# Patient Record
Sex: Female | Born: 1955 | Race: White | Hispanic: No | Marital: Married | Smoking: Never smoker
Health system: Southern US, Community
[De-identification: ages and names within clinical notes are randomized; demographics above are authoritative.]

## PROBLEM LIST (undated history)

## (undated) DIAGNOSIS — K589 Irritable bowel syndrome without diarrhea: Secondary | ICD-10-CM

## (undated) DIAGNOSIS — T7840XA Allergy, unspecified, initial encounter: Secondary | ICD-10-CM

## (undated) DIAGNOSIS — M8580 Other specified disorders of bone density and structure, unspecified site: Secondary | ICD-10-CM

## (undated) DIAGNOSIS — K115 Sialolithiasis: Secondary | ICD-10-CM

## (undated) HISTORY — PX: OOPHORECTOMY: SHX86

## (undated) HISTORY — DX: Other specified disorders of bone density and structure, unspecified site: M85.80

## (undated) HISTORY — PX: APPENDECTOMY: SHX54

## (undated) HISTORY — DX: Irritable bowel syndrome, unspecified: K58.9

## (undated) HISTORY — DX: Allergy, unspecified, initial encounter: T78.40XA

## (undated) HISTORY — DX: Sialolithiasis: K11.5

## (undated) HISTORY — PX: LAPAROSCOPY ABDOMEN DIAGNOSTIC: PRO50

## (undated) HISTORY — PX: SALIVARY STONE REMOVAL: SHX5213

---

## 1997-06-11 HISTORY — PX: ABDOMINAL HYSTERECTOMY: SHX81

## 2013-04-14 ENCOUNTER — Ambulatory Visit: Payer: Self-pay

## 2013-04-21 ENCOUNTER — Ambulatory Visit: Payer: Self-pay | Admitting: Otolaryngology

## 2013-10-13 ENCOUNTER — Ambulatory Visit: Payer: Self-pay | Admitting: Otolaryngology

## 2013-11-30 ENCOUNTER — Ambulatory Visit: Payer: Self-pay | Admitting: Otolaryngology

## 2013-12-01 LAB — PATHOLOGY REPORT

## 2014-10-02 NOTE — Op Note (Signed)
PATIENT NAME:  BOLDLouie Shaw, Jo Shaw MR#:  [id number] DATE OF BIRTH:  16-Nov-1955  DATE OF PROCEDURE:  11/30/2013  PREOPERATIVE DIAGNOSES:  1. Left submandibular sialoadenitis.  2. Left submandibular sialolithiasis.   POSTOPERATIVE DIAGNOSES:  1. Left submandibular sialoadenitis.  2. Left submandibular sialolithiasis.   OPERATIVE PROCEDURE: Excision left submandibular gland with removal of intraoral duct.   SURGEON: Cammy CopaPaul H. Gurman Ashland, M.D.   ASSISTANT: Dr. Marion DownerScott Bennett.  ANESTHESIA: General.   COMPLICATIONS: None.    TOTAL ESTIMATED BLOOD LOSS: Minimal.   PROCEDURE: The patient was given general anesthesia by oral endotracheal intubation. The patient was supine position with the shoulder roll head, turned to the left side. The neck was marked and an incision line was marked at two fingerbreadths below the margin of the mandible. The incision line was then injected with 4 mL of 1% Xylocaine mixed with epinephrine 1:100,000. The patient was prepped and draped in sterile fashion.   The incision was created following the marked lines, carried through the skin and subcutaneous. Bleeding was controlled with electrocautery. The platysma was elevated with care taken to evaluate the facial nerve to make sure there was no injury to the marginal mandibular branch. Electrodes then placed around the perioral muscle to monitor this continuously. With the platysma layer cut through, the fascial vein was noted. This was crossclamped and tied and elevated, this elevated the marginal mandibular branch of the facial nerve. The inferior portion of the submandibular gland was very evident and it was quite prominent, was freed inferiorly from its fascial attachments, posteriorly it is freed up as well and anteriorly. Once this was freed up there a clamp was placed on the gland and it was delivered out of the wound, you could see now  the more superficial attachments were freed superiorly, with again care taken to make sure  the marginal branch was not compromised. More anteriorly, the mylohyoid muscle was pulled back, with the attachments here. The lingual nerve was located and part of the facial artery went through the gland and needed to be crossclamped and tied here as well. The branch from the lingual nerve to the gland was cut across with the Harmonic. The gland was followed anteriorly with the duct into sublingual gland. There was significant amount of sublingual tissue, much of this was trimmed off and then the gland was cut across with the Harmonic. The wound was extremely dry. There is no significant blood loss at all. A small #7 TLS drain was placed through a separate inferior stab incision. The wound was copiously irrigated. No further bleeding was noted. The platysma was closed with 4-0 Vicryl followed by 4-0 Vicryl for the subcutaneous tissues. The skin edges were then closed with 6-0 nylon in a running locking suture. The wound was then covered with Telfa and Tegaderm dressing.   The patient was then turned back to midline and a mouth gag was used for opening up the teeth to visualize the floor of the mouth. The left tip of Wharton's duct was reddened sticking out a little bit. A couple of milliliters of 1% Xylocaine with epinephrine 1:200,000 were used for infiltration of the floor of the mouth. An incision was created around the duct through the mucosa and the mucosa was tracked back and the duct was removed from this area. There is some attachments of minor salivary glands to the duct. These were freed up. The duct was somewhat thickened and followed all the way back to its base removing some of  the sublingual gland attachments to it as well. This left a smooth floor of the mouth. The lingual nerve was intact underneath the fascia of the tongue musculature, and there is no significant bleeding here. 5-0 chromic were then used to close the mucosa loosely at the left anterior floor of the mouth.   The patient  tolerated the procedure well. She was awakened and taken to the recovery room in satisfactory condition. There were no operative complications.   ____________________________ Cammy Copa, MD phj:sg D: 11/30/2013 09:25:06 ET T: 11/30/2013 09:42:00 ET JOB#: [id number]  cc: Cammy Copa, MD, <Dictator> Cammy Copa MD ELECTRONICALLY SIGNED 12/15/2013 8:41

## 2015-07-01 LAB — HM COLONOSCOPY

## 2015-09-03 IMAGING — CT CT NECK WITHOUT AND WITH CONTRAST
4 of 6 series · 15 of 33 positions shown, 17 images · IV contrast (isovue)
Comparison: None.

CLINICAL DATA: Left neck swelling of 1 year duration. Skin markers
in place on the left. Left-sided pain.

EXAM:
CT NECK WITH AND WITHOUT CONTRAST
TECHNIQUE: Multidetector CT imaging of the neck was performed without and with
intravenous contrast.
CONTRAST:  75 cc Isovue 370

[Series 5: axial · axial · 0.43mm/px · z∈[-205,-101]mm · 3 of 106 slices shown]
[im 27/106  bone]
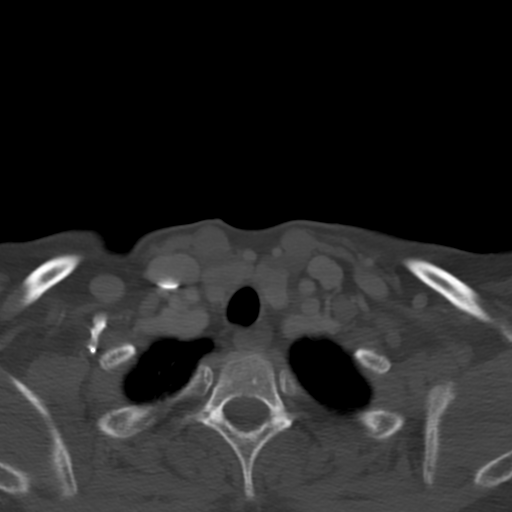
[im 53/106  bone]
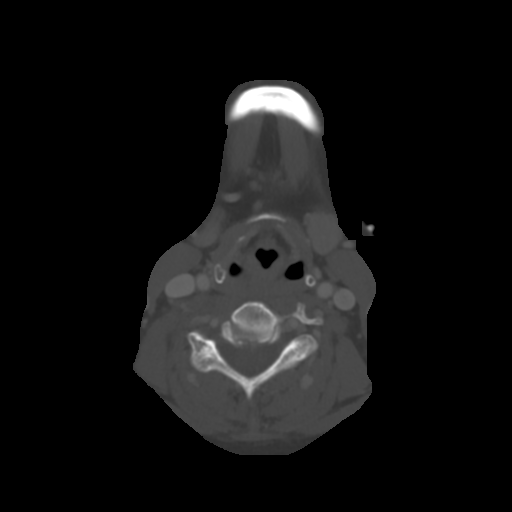
[im 79/106  bone]
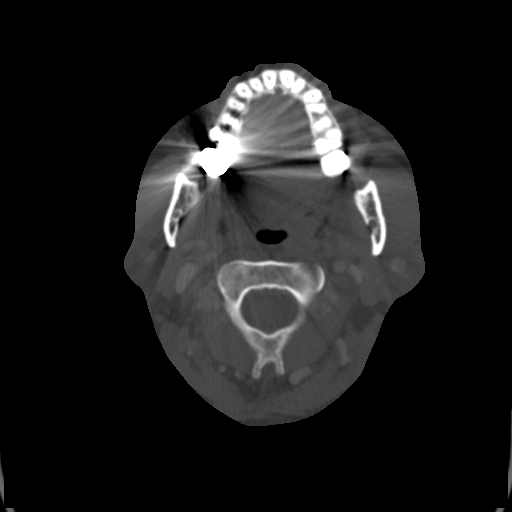

[Series 6: sag neck · sagittal · 0.42mm/px · 5 of 101 slices shown, 6 images]
[im 34/101  bone]
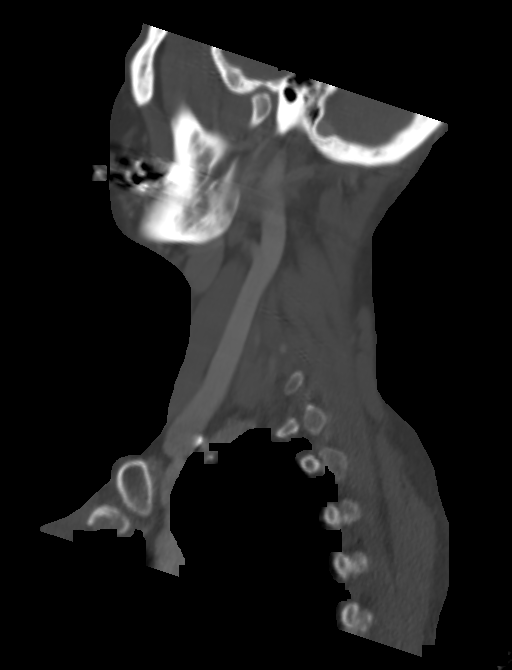
[im 42/101  bone]
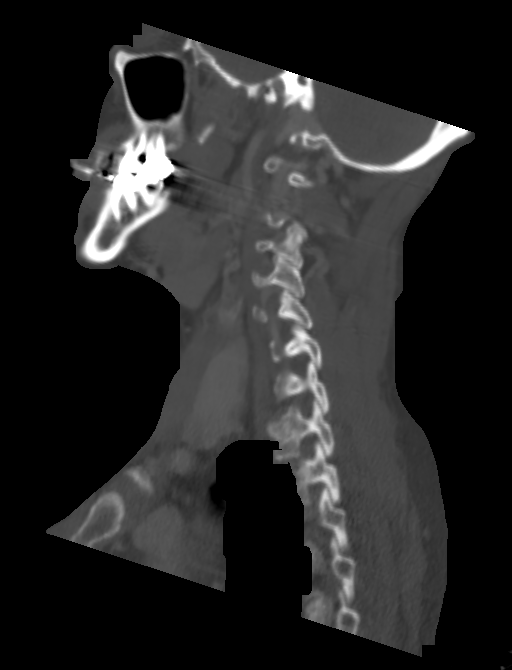
[im 51/101  soft-tissue]
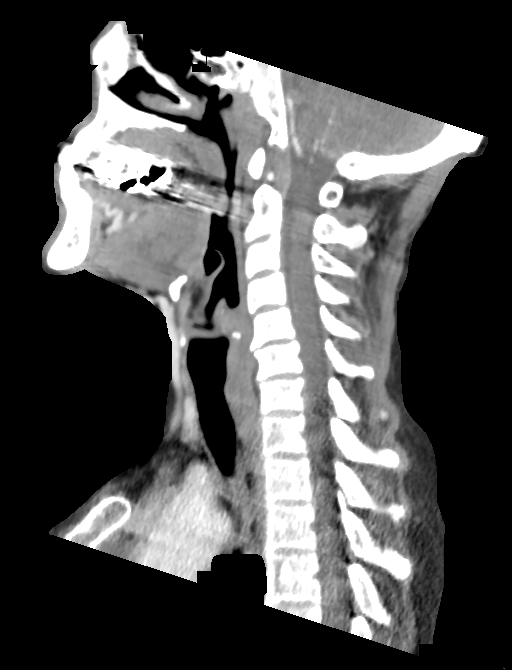
[im 51/101  bone]
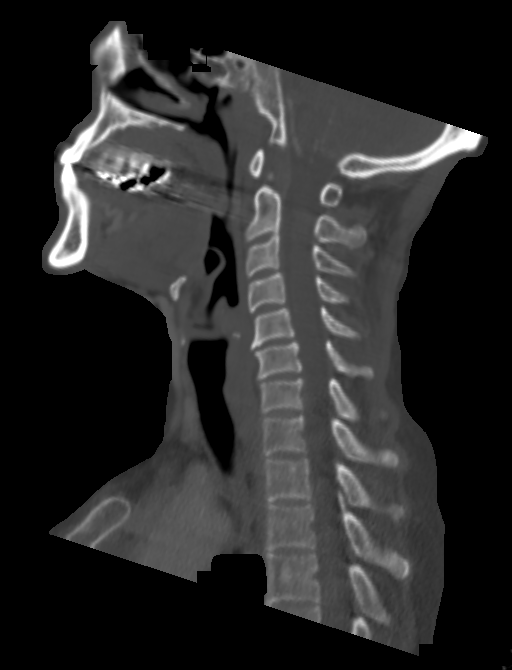
[im 59/101  bone]
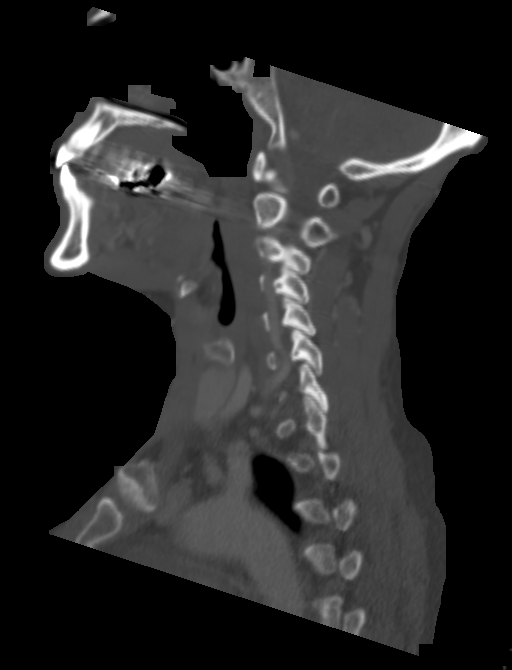
[im 67/101  bone]
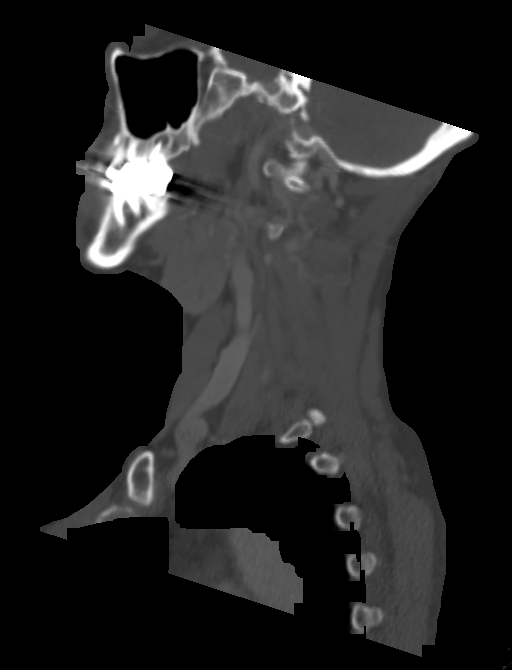

[Series 7: cor neck · coronal · 0.43mm/px · 3 of 106 slices shown]
[im 33/106  bone]
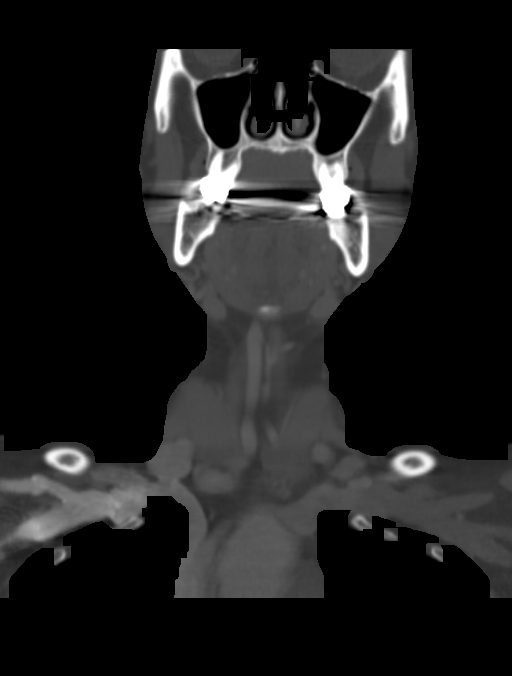
[im 46/106  bone]
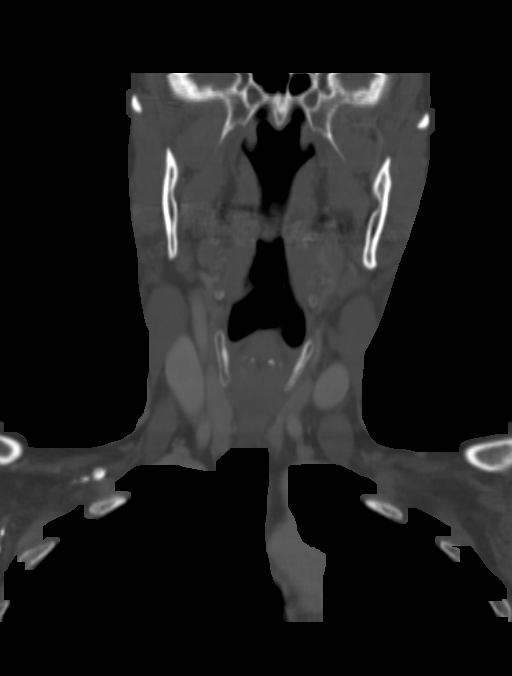
[im 60/106  bone]
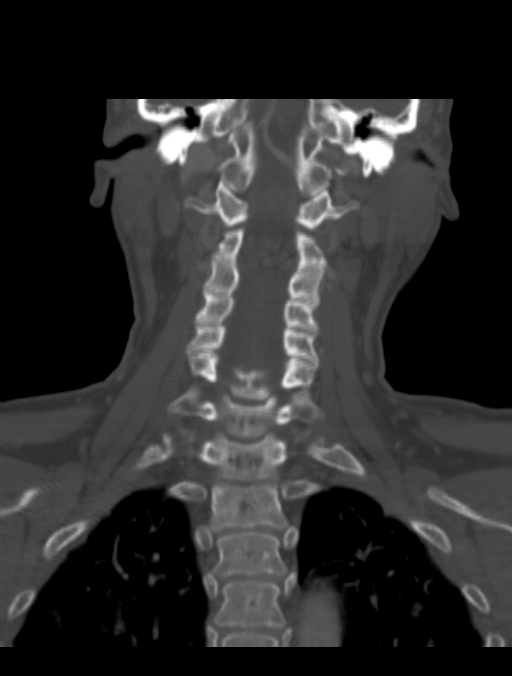

[Series 8: ax oropharynx · axial · 0.43mm/px · z∈[-268,-116]mm · 4 of 136 slices shown, 5 images]
[im 28/136  soft-tissue]
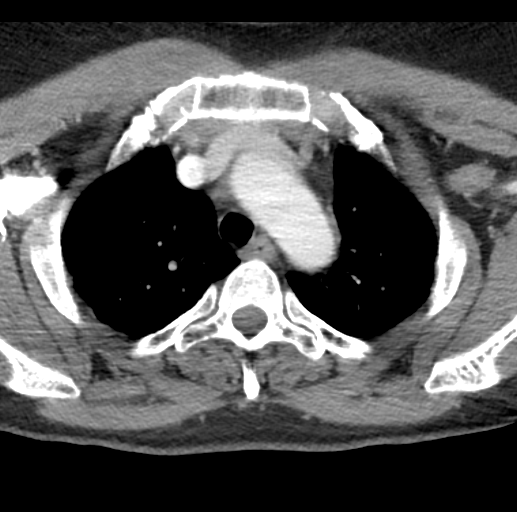
[im 28/136  bone]
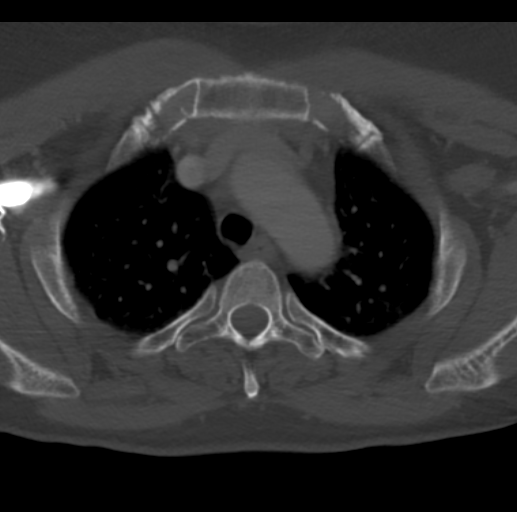
[im 55/136  bone]
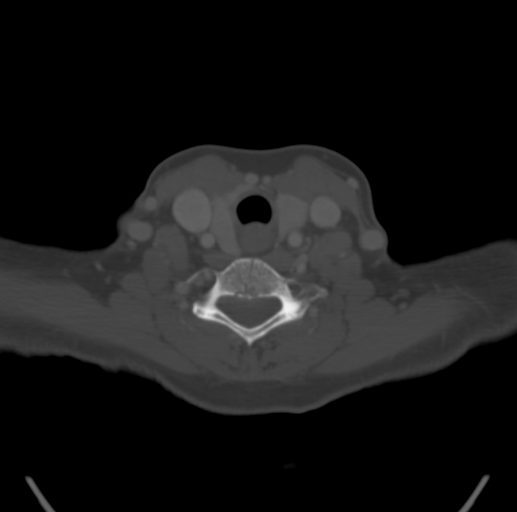
[im 82/136  bone]
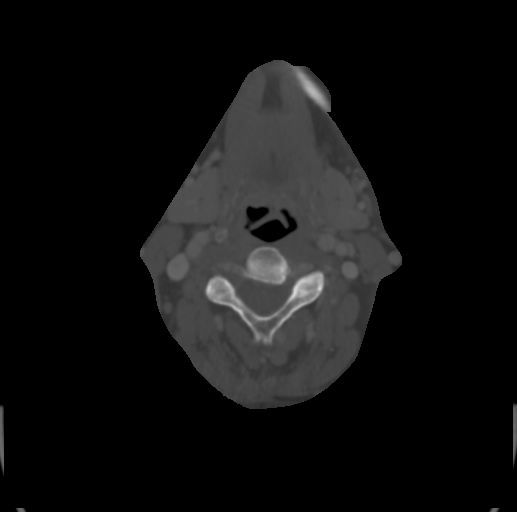
[im 109/136  bone]
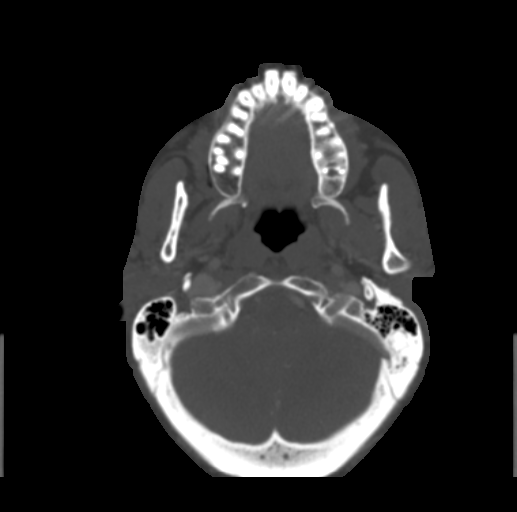

[15 of 33 positions shown; findings below may reference images not displayed]

FINDINGS: Limited visualization of the intracranial contents does not show any
abnormality. Visualized sinuses are clear.

Both parotid glands are normal. No evidence of mass, inflammation,
ductal dilatation or stone. Some streak artifact from the teeth does
extend through the region of the parotid glands.

Both submandibular glands appear symmetric and normal. No swelling
or apparent active inflammation patient does have a 4 mm stone in
the distal submandibular duct on the left.

Thyroid gland is normal. No enlarged lymph nodes or other soft
tissue masses on either side of the neck. Arterial and venous
structures appear normal. There is ordinary mild cervical
spondylosis.
IMPRESSION: 4 mm stone in the distal submandibular duct on the left. No other
salivary stone. No sign of inflammation or ductal obstruction by
imaging however.

## 2017-09-16 ENCOUNTER — Ambulatory Visit (INDEPENDENT_AMBULATORY_CARE_PROVIDER_SITE_OTHER): Payer: BC Managed Care – PPO | Admitting: Family Medicine

## 2017-09-16 ENCOUNTER — Ambulatory Visit
Admission: RE | Admit: 2017-09-16 | Discharge: 2017-09-16 | Disposition: A | Discharge status: Home / Self Care | Payer: BC Managed Care – PPO | Source: Ambulatory Visit | Attending: Family Medicine | Admitting: Family Medicine

## 2017-09-16 ENCOUNTER — Encounter: Payer: Self-pay | Admitting: Family Medicine

## 2017-09-16 VITALS — BP 116/70 | HR 76 | Temp 98.2°F | Resp 16 | Ht 65.0 in | Wt 188.0 lb

## 2017-09-16 DIAGNOSIS — R0989 Other specified symptoms and signs involving the circulatory and respiratory systems: Secondary | ICD-10-CM | POA: Insufficient documentation

## 2017-09-16 DIAGNOSIS — R05 Cough: Secondary | ICD-10-CM | POA: Insufficient documentation

## 2017-09-16 DIAGNOSIS — L918 Other hypertrophic disorders of the skin: Secondary | ICD-10-CM | POA: Diagnosis not present

## 2017-09-16 DIAGNOSIS — Z1231 Encounter for screening mammogram for malignant neoplasm of breast: Secondary | ICD-10-CM

## 2017-09-16 DIAGNOSIS — Z1159 Encounter for screening for other viral diseases: Secondary | ICD-10-CM

## 2017-09-16 DIAGNOSIS — Z1239 Encounter for other screening for malignant neoplasm of breast: Secondary | ICD-10-CM

## 2017-09-16 DIAGNOSIS — Z0000 Encounter for general adult medical examination without abnormal findings: Secondary | ICD-10-CM | POA: Diagnosis not present

## 2017-09-16 DIAGNOSIS — Z780 Asymptomatic menopausal state: Secondary | ICD-10-CM

## 2017-09-16 DIAGNOSIS — K589 Irritable bowel syndrome without diarrhea: Secondary | ICD-10-CM | POA: Insufficient documentation

## 2017-09-16 DIAGNOSIS — M8580 Other specified disorders of bone density and structure, unspecified site: Secondary | ICD-10-CM

## 2017-09-16 DIAGNOSIS — K581 Irritable bowel syndrome with constipation: Secondary | ICD-10-CM

## 2017-09-16 MED ORDER — BENZONATATE 200 MG PO CAPS: 200.0000 mg | ORAL_CAPSULE | Freq: Two times a day (BID) | ORAL | 0 refills | Status: DC | PRN

## 2017-09-16 NOTE — Assessment & Plan Note (Signed)
Referral to dermatology per patient preference

## 2017-09-16 NOTE — Assessment & Plan Note (Signed)
Followed by gastroenterology Continue current medications We will request records from GI

## 2017-09-16 NOTE — Patient Instructions (Signed)
Preventive Care 40-64 Years, Female Preventive care refers to lifestyle choices and visits with your health care provider that can promote health and wellness. What does preventive care include?  A yearly physical exam. This is also called an annual well check.  Dental exams once or twice a year.  Routine eye exams. Ask your health care provider how often you should have your eyes checked.  Personal lifestyle choices, including: ? Daily care of your teeth and gums. ? Regular physical activity. ? Eating a healthy diet. ? Avoiding tobacco and drug use. ? Limiting alcohol use. ? Practicing safe sex. ? Taking low-dose aspirin daily starting at age 58. ? Taking vitamin and mineral supplements as recommended by your health care provider. What happens during an annual well check? The services and screenings done by your health care provider during your annual well check will depend on your age, overall health, lifestyle risk factors, and family history of disease. Counseling Your health care provider may ask you questions about your:  Alcohol use.  Tobacco use.  Drug use.  Emotional well-being.  Home and relationship well-being.  Sexual activity.  Eating habits.  Work and work Statistician.  Method of birth control.  Menstrual cycle.  Pregnancy history.  Screening You may have the following tests or measurements:  Height, weight, and BMI.  Blood pressure.  Lipid and cholesterol levels. These may be checked every 5 years, or more frequently if you are over 81 years old.  Skin check.  Lung cancer screening. You may have this screening every year starting at age 78 if you have a 30-pack-year history of smoking and currently smoke or have quit within the past 15 years.  Fecal occult blood test (FOBT) of the stool. You may have this test every year starting at age 65.  Flexible sigmoidoscopy or colonoscopy. You may have a sigmoidoscopy every 5 years or a colonoscopy  every 10 years starting at age 30.  Hepatitis C blood test.  Hepatitis B blood test.  Sexually transmitted disease (STD) testing.  Diabetes screening. This is done by checking your blood sugar (glucose) after you have not eaten for a while (fasting). You may have this done every 1-3 years.  Mammogram. This may be done every 1-2 years. Talk to your health care provider about when you should start having regular mammograms. This may depend on whether you have a family history of breast cancer.  BRCA-related cancer screening. This may be done if you have a family history of breast, ovarian, tubal, or peritoneal cancers.  Pelvic exam and Pap test. This may be done every 3 years starting at age 80. Starting at age 36, this may be done every 5 years if you have a Pap test in combination with an HPV test.  Bone density scan. This is done to screen for osteoporosis. You may have this scan if you are at high risk for osteoporosis.  Discuss your test results, treatment options, and if necessary, the need for more tests with your health care provider. Vaccines Your health care provider may recommend certain vaccines, such as:  Influenza vaccine. This is recommended every year.  Tetanus, diphtheria, and acellular pertussis (Tdap, Td) vaccine. You may need a Td booster every 10 years.  Varicella vaccine. You may need this if you have not been vaccinated.  Zoster vaccine. You may need this after age 5.  Measles, mumps, and rubella (MMR) vaccine. You may need at least one dose of MMR if you were born in  1957 or later. You may also need a second dose.  Pneumococcal 13-valent conjugate (PCV13) vaccine. You may need this if you have certain conditions and were not previously vaccinated.  Pneumococcal polysaccharide (PPSV23) vaccine. You may need one or two doses if you smoke cigarettes or if you have certain conditions.  Meningococcal vaccine. You may need this if you have certain  conditions.  Hepatitis A vaccine. You may need this if you have certain conditions or if you travel or work in places where you may be exposed to hepatitis A.  Hepatitis B vaccine. You may need this if you have certain conditions or if you travel or work in places where you may be exposed to hepatitis B.  Haemophilus influenzae type b (Hib) vaccine. You may need this if you have certain conditions.  Talk to your health care provider about which screenings and vaccines you need and how often you need them. This information is not intended to replace advice given to you by your health care provider. Make sure you discuss any questions you have with your health care provider. Document Released: 06/24/2015 Document Revised: 02/15/2016 Document Reviewed: 03/29/2015 Elsevier Interactive Patient Education  2018 Elsevier Inc.  

## 2017-09-16 NOTE — Assessment & Plan Note (Signed)
Patient with URI symptoms and crackles in right lung base Get chest x-ray to ensure no pneumonia Could also represent atelectasis We will treat as indicated by chest x-ray

## 2017-09-16 NOTE — Assessment & Plan Note (Signed)
Repeat DEXA scan Continue calcium and vitamin D supplement Request records from previous PCP

## 2017-09-16 NOTE — Progress Notes (Signed)
Patient: Jo Shaw, Female    DOB: October 04, 1955, 62 y.o.   MRN: [id number] Visit Date: 09/16/2017  Today's Provider: Shirlee Latch, MD   I, Jo Shaw, CMA, am acting as scribe for Shirlee Latch, MD.  Chief Complaint  Patient presents with  . Establish Care   Subjective:    Establish Care Jo Shaw Jo Shaw is a 62 y.o. female who presents today to establish care and for a physical. She feels fairly well. She is c/o URI sx. She reports exercising none. She reports she is sleeping fairly well.  She reports cough, nasal congestion, rhinorrhea, shortness of breath for the last 3-5 days. She has not taken any medication for this.  She denies any chest pain.  She does not think she has had any fevers.  She denies abdominal pain, dysuria, or other symptoms.  Patient was previously followed by dermatology for multiple skin issues.  She is concerned about multiple skin tags around her necklace area and axilla.  She reports these become irritated when they catch on her clothing and they also itch sometimes.  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  She believes that she had another colonoscopy around 2017 with Dr. Pennie Banter in [facility].  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  She is taking Amitiza and Colace which seem to help with her constipation Last mammogram- pt is due. States her last mammogram was checked by [facility] many years ago. Pt is s/p total hysterectomy (1999) due to endometriosis. Last BMD- 5 years ago. Osteopenia.  Has been taking Ca and Vit D.  Tries to stay active with yard work. Pt believes her last tetanus vaccine was in 2010. She had a shingles vaccine a few years ago at [facility] in [facility]. -----------------------------------------------------------------   Review of Systems  Constitutional: Negative.   HENT: Positive for dental problem.  Negative for congestion, drooling, ear discharge, ear pain, facial swelling, hearing loss, mouth sores, nosebleeds, postnasal drip, rhinorrhea, sinus pressure, sinus pain, sneezing, sore throat, tinnitus, trouble swallowing and voice change.   Eyes: Positive for photophobia. Negative for pain, discharge, redness, itching and visual disturbance.  Respiratory: Negative.   Cardiovascular: Negative.   Gastrointestinal: Positive for abdominal distention, abdominal pain, anal bleeding, constipation and diarrhea. Negative for blood in stool, nausea, rectal pain and vomiting.  Endocrine: Negative.   Genitourinary: Positive for decreased urine volume and frequency. Negative for difficulty urinating, dyspareunia, dysuria, enuresis, flank pain, genital sores, hematuria, menstrual problem, pelvic pain, urgency, vaginal bleeding, vaginal discharge and vaginal pain.  Musculoskeletal: Negative.   Skin: Negative.   Allergic/Immunologic: Positive for environmental allergies. Negative for food allergies and immunocompromised state.  Neurological: Positive for headaches. Negative for dizziness, tremors, seizures, syncope, facial asymmetry, speech difficulty, weakness, light-headedness and numbness.  Hematological: Negative.   Psychiatric/Behavioral: Negative.     Social History      She  reports that she has never smoked. She has never used smokeless tobacco. She reports that she does not drink alcohol or use drugs.       Social History   Socioeconomic History  . Marital status: Married    Spouse name: Lillia Abed  . Number of children: 1  . Years of education: 23  . Highest education level: Some college, no degree  Occupational History  . Occupation: Artist: [employer]  Social Needs  .  Financial resource strain: Not hard at all  . Food insecurity:    Worry: Never true    Inability: Never true  . Transportation needs:    Medical: No    Non-medical: No  Tobacco Use  .  Smoking status: Never Smoker  . Smokeless tobacco: Never Used  Substance and Sexual Activity  . Alcohol use: Never    Frequency: Never  . Drug use: Never  . Sexual activity: Not Currently    Partners: Male    Birth control/protection: Surgical  Lifestyle  . Physical activity:    Days per week: 0 days    Minutes per session: 0 min  . Stress: Not on file  Relationships  . Social connections:    Talks on phone: Not on file    Gets together: Not on file    Attends religious service: Not on file    Active member of club or organization: Not on file    Attends meetings of clubs or organizations: Not on file    Relationship status: Not on file  Other Topics Concern  . Not on file  Social History Narrative  . Not on file    Past Medical History:  Diagnosis Date  . Allergy   . Osteopenia   . Salivary stones   . Spastic colon     Patient Active Problem List   Diagnosis Date Noted  . Osteopenia 09/16/2017  . Spastic colon 09/16/2017    Past Surgical History:  Procedure Laterality Date  . ABDOMINAL HYSTERECTOMY  1999   total due to endometriosis  . APPENDECTOMY    . LAPAROSCOPY ABDOMEN DIAGNOSTIC    . SALIVARY STONE REMOVAL      Family History        Family Status  Relation Name Status  . Mother  Alive  . Father  Alive  . Sister  Alive  . Brother  Alive  . Emelda Brothers  (Not Specified)  . Oneal Grout  (Not Specified)  . MGM  (Not Specified)  . PGF  (Not Specified)  . Sister  Alive  . Sister  Alive  . Cousin  (Not Specified)        Her family history includes Breast cancer in her cousin; Cancer in her mother; Cervical cancer in her mother; Healthy in her brother and sister; Heart disease in her maternal grandmother, mother, paternal aunt, and paternal uncle; Hypertension in her sister and sister; Prostate cancer in her father; Sleep apnea in her sister; Throat cancer in her paternal grandfather.      Allergies  Allergen Reactions  . Codeine Nausea And Vomiting     headache     Current Outpatient Medications:  .  AMITIZA 8 MCG capsule, Take 8 mcg by mouth 2 (two) times daily., Disp: , Rfl: 11 .  Calcium-Magnesium-Vitamin D (CALCIUM 1200+D3 PO), Take by mouth., Disp: , Rfl:  .  docusate sodium (COLACE) 250 MG capsule, Take by mouth., Disp: , Rfl:  .  KRILL OIL PO, Take by mouth., Disp: , Rfl:  .  Multiple Vitamin (MULTIVITAMIN) tablet, Take 1 tablet by mouth daily., Disp: , Rfl:  .  Potassium 99 MG TABS, Take by mouth., Disp: , Rfl:  .  vitamin C (ASCORBIC ACID) 500 MG tablet, Take 500 mg by mouth daily., Disp: , Rfl:  .  VITAMIN K PO, Take by mouth., Disp: , Rfl:  .  benzonatate (TESSALON) 200 MG capsule, Take 1 capsule (200 mg total) by mouth 2 (two) times  daily as needed for cough., Disp: 40 capsule, Rfl: 0   Patient Care Team: Erasmo DownerBacigalupo, Gerline Ratto M, MD as PCP - General (Family Medicine)      Objective:   Vitals: BP 116/70 (BP Location: Left Arm, Patient Position: Sitting, Cuff Size: Large)   Pulse 76   Temp 98.2 F (36.8 C) (Oral)   Resp 16   Ht 5\' 5"  (1.651 m)   Wt 188 lb (85.3 kg)   SpO2 96%   BMI 31.28 kg/m    Vitals:   09/16/17 1409  BP: 116/70  Pulse: 76  Resp: 16  Temp: 98.2 F (36.8 C)  TempSrc: Oral  SpO2: 96%  Weight: 188 lb (85.3 kg)  Height: 5\' 5"  (1.651 m)     Physical Exam  Constitutional: She is oriented to person, place, and time. She appears well-developed and well-nourished. No distress.  HENT:  Head: Normocephalic and atraumatic.  Right Ear: External ear normal.  Left Ear: External ear normal.  Nose: Nose normal.  Mouth/Throat: Oropharynx is clear and moist. No oropharyngeal exudate.  Eyes: Pupils are equal, round, and reactive to light. Conjunctivae and EOM are normal. No scleral icterus.  Neck: Neck supple. No thyromegaly present.  Cardiovascular: Normal rate, regular rhythm, normal heart sounds and intact distal pulses.  No murmur heard. Pulmonary/Chest: Effort normal. No respiratory distress. She  has no wheezes. She has rales in the right lower field.  Abdominal: Soft. Bowel sounds are normal. She exhibits no distension. There is no tenderness. There is no rebound and no guarding.  Genitourinary:  Genitourinary Comments: Breasts: breasts appear normal, no suspicious masses, no skin or nipple changes or axillary nodes.   Musculoskeletal: She exhibits no edema or deformity.  Lymphadenopathy:    She has no cervical adenopathy.  Neurological: She is alert and oriented to person, place, and time.  Skin: Skin is warm and dry. No rash noted.  Multiple skin tags around neckline  Psychiatric: She has a normal mood and affect. Her behavior is normal.  Vitals reviewed.    Depression Screen PHQ 2/9 Scores 09/16/2017  PHQ - 2 Score 0    Assessment & Plan:     Routine Health Maintenance and Physical Exam  Exercise Activities and Dietary recommendations Goals    None       There is no immunization history on file for this patient.  Health Maintenance  Topic Date Due  . Hepatitis C Screening  August 24, 1955  . HIV Screening  01/14/1971  . TETANUS/TDAP  01/14/1975  . MAMMOGRAM  01/13/2006  . COLONOSCOPY  01/13/2006  . INFLUENZA VACCINE  01/09/2018     Discussed health benefits of physical activity, and encouraged her to engage in regular exercise appropriate for her age and condition.    -------------------------------------------------------------------- Problem List Items Addressed This Visit      Digestive   Spastic colon    Followed by gastroenterology Continue current medications We will request records from GI      Relevant Medications   AMITIZA 8 MCG capsule   docusate sodium (COLACE) 250 MG capsule     Musculoskeletal and Integument   Osteopenia    Repeat DEXA scan Continue calcium and vitamin D supplement Request records from previous PCP      Relevant Orders   DG Bone Density   Skin tags, multiple acquired    Referral to dermatology per patient  preference      Relevant Orders   Ambulatory referral to Dermatology     Other  Respiratory crackles at right lung base    Patient with URI symptoms and crackles in right lung base Get chest x-ray to ensure no pneumonia Could also represent atelectasis We will treat as indicated by chest x-ray      Relevant Orders   DG Chest 2 View    Other Visit Diagnoses    Encounter for annual physical exam    -  Primary   Relevant Orders   Comprehensive metabolic panel   CBC w/Diff/Platelet   Lipid panel   Postmenopausal       Relevant Orders   DG Bone Density   Need for hepatitis C screening test       Relevant Orders   Hepatitis C Antibody   Screening for breast cancer       Relevant Orders   MS DIGITAL SCREENING TOMO BILATERAL       Return in about 1 year (around 09/17/2018) for physical.   The entirety of the information documented in the History of Present Illness, Review of Systems and Physical Exam were personally obtained by me. Portions of this information were initially documented by Irving Burton Ratchford, CMA and reviewed by me for thoroughness and accuracy.    Erasmo Downer, MD, MPH [facility] 09/16/2017 4:18 PM

## 2017-09-17 ENCOUNTER — Telehealth: Payer: Self-pay

## 2017-09-17 NOTE — Telephone Encounter (Signed)
Pt advised.

## 2017-09-17 NOTE — Telephone Encounter (Signed)
-----   Message from Erasmo DownerAngela M Bacigalupo, MD sent at 09/17/2017  9:46 AM EDT ----- No pneumonia.  Mild bronchitis.  Continue symptomatic treatment. This is viral.  Beryle FlockBacigalupo, Marzella SchleinAngela M, MD, MPH [facility] 09/17/2017 9:46 AM

## 2017-09-18 ENCOUNTER — Telehealth: Payer: Self-pay

## 2017-09-18 LAB — CBC WITH DIFFERENTIAL/PLATELET
BASOS: 0 %
Basophils Absolute: 0 10*3/uL (ref 0.0–0.2)
EOS (ABSOLUTE): 0.1 10*3/uL (ref 0.0–0.4)
Eos: 2 %
HEMATOCRIT: 41.1 % (ref 34.0–46.6)
HEMOGLOBIN: 14.1 g/dL (ref 11.1–15.9)
Immature Grans (Abs): 0 10*3/uL (ref 0.0–0.1)
Immature Granulocytes: 0 %
LYMPHS ABS: 2.2 10*3/uL (ref 0.7–3.1)
Lymphs: 48 %
MCH: 33.4 pg — ABNORMAL HIGH (ref 26.6–33.0)
MCHC: 34.3 g/dL (ref 31.5–35.7)
MCV: 97 fL (ref 79–97)
MONOCYTES: 6 %
MONOS ABS: 0.3 10*3/uL (ref 0.1–0.9)
NEUTROS ABS: 2 10*3/uL (ref 1.4–7.0)
Neutrophils: 44 %
Platelets: 234 10*3/uL (ref 150–379)
RBC: 4.22 x10E6/uL (ref 3.77–5.28)
RDW: 13.5 % (ref 12.3–15.4)
WBC: 4.6 10*3/uL (ref 3.4–10.8)

## 2017-09-18 LAB — COMPREHENSIVE METABOLIC PANEL
A/G RATIO: 2.3 — AB (ref 1.2–2.2)
ALBUMIN: 4.3 g/dL (ref 3.6–4.8)
ALT: 20 IU/L (ref 0–32)
AST: 22 IU/L (ref 0–40)
Alkaline Phosphatase: 84 IU/L (ref 39–117)
BILIRUBIN TOTAL: 0.4 mg/dL (ref 0.0–1.2)
BUN / CREAT RATIO: 17 (ref 12–28)
BUN: 15 mg/dL (ref 8–27)
CHLORIDE: 102 mmol/L (ref 96–106)
CO2: 23 mmol/L (ref 20–29)
Calcium: 8.9 mg/dL (ref 8.7–10.3)
Creatinine, Ser: 0.88 mg/dL (ref 0.57–1.00)
GFR calc Af Amer: 82 mL/min/{1.73_m2} (ref >59)
GFR calc non Af Amer: 71 mL/min/{1.73_m2} (ref >59)
GLOBULIN, TOTAL: 1.9 g/dL (ref 1.5–4.5)
GLUCOSE: 82 mg/dL (ref 65–99)
Potassium: 4.1 mmol/L (ref 3.5–5.2)
SODIUM: 143 mmol/L (ref 134–144)
TOTAL PROTEIN: 6.2 g/dL (ref 6.0–8.5)

## 2017-09-18 LAB — HEPATITIS C ANTIBODY

## 2017-09-18 LAB — LIPID PANEL
CHOL/HDL RATIO: 3.1 ratio (ref 0.0–4.4)
Cholesterol, Total: 162 mg/dL (ref 100–199)
HDL: 53 mg/dL (ref >39)
LDL CALC: 88 mg/dL (ref 0–99)
Triglycerides: 104 mg/dL (ref 0–149)
VLDL Cholesterol Cal: 21 mg/dL (ref 5–40)

## 2017-09-18 NOTE — Telephone Encounter (Signed)
-----   Message from Erasmo DownerAngela M Bacigalupo, MD sent at 09/18/2017  8:33 AM EDT ----- Normal kidney function, liver function, electrolytes, Blood counts, cholesterol.  Negative Hep C screening  Bacigalupo, Marzella SchleinAngela M, MD, MPH [facility] 09/18/2017 8:32 AM

## 2017-09-18 NOTE — Telephone Encounter (Signed)
LMTCB  Thanks,  -Verne Lanuza 

## 2017-09-19 NOTE — Telephone Encounter (Signed)
Pt advised.

## 2017-09-30 ENCOUNTER — Encounter: Payer: Self-pay | Admitting: Family Medicine

## 2017-09-30 ENCOUNTER — Ambulatory Visit (INDEPENDENT_AMBULATORY_CARE_PROVIDER_SITE_OTHER): Payer: BC Managed Care – PPO | Admitting: Family Medicine

## 2017-09-30 ENCOUNTER — Telehealth: Payer: Self-pay | Admitting: Family Medicine

## 2017-09-30 VITALS — BP 112/82 | HR 80 | Temp 98.2°F | Resp 16 | Wt 184.0 lb

## 2017-09-30 DIAGNOSIS — H66001 Acute suppurative otitis media without spontaneous rupture of ear drum, right ear: Secondary | ICD-10-CM

## 2017-09-30 DIAGNOSIS — J0100 Acute maxillary sinusitis, unspecified: Secondary | ICD-10-CM | POA: Diagnosis not present

## 2017-09-30 DIAGNOSIS — J40 Bronchitis, not specified as acute or chronic: Secondary | ICD-10-CM

## 2017-09-30 MED ORDER — AMOXICILLIN-POT CLAVULANATE 875-125 MG PO TABS: 1.0000 | ORAL_TABLET | Freq: Two times a day (BID) | ORAL | 0 refills | Status: AC

## 2017-09-30 MED ORDER — BENZONATATE 200 MG PO CAPS: 200.0000 mg | ORAL_CAPSULE | Freq: Two times a day (BID) | ORAL | 0 refills | Status: AC | PRN

## 2017-09-30 MED ORDER — ONDANSETRON HCL 4 MG PO TABS: 4.0000 mg | ORAL_TABLET | Freq: Three times a day (TID) | ORAL | 0 refills | Status: AC | PRN

## 2017-09-30 NOTE — Progress Notes (Signed)
Patient: Jo Shaw Female    DOB: 25-Apr-1956   62 y.o.   MRN: [id number] Visit Date: 09/30/2017  Today's Provider: Shirlee Latch, MD   I, [name], CMA, am acting as scribe for Shirlee Latch, MD.  Chief Complaint  Patient presents with  . URI   Subjective:    URI   Episode onset: x 2-3 weeks. Progression since onset: pt though she was improving, but she suddenly worsened over the weekend. There has been no fever. Associated symptoms include abdominal pain, chest pain, congestion, coughing (dry), diarrhea, headaches, nausea, neck pain, a plugged ear sensation, rhinorrhea, sinus pain and wheezing. Pertinent negatives include no dysuria, ear pain, sneezing, sore throat, swollen glands or vomiting. Treatments tried: [employer], Mucinex, Coricidin. The treatment provided mild relief.   Cough is productive of clear sputum.    Allergies  Allergen Reactions  . Codeine Nausea And Vomiting    headache     Current Outpatient Medications:  .  AMITIZA 8 MCG capsule, Take 8 mcg by mouth 2 (two) times daily., Disp: , Rfl: 11 .  benzonatate (TESSALON) 200 MG capsule, Take 1 capsule (200 mg total) by mouth 2 (two) times daily as needed for cough., Disp: 40 capsule, Rfl: 0 .  Calcium-Magnesium-Vitamin D (CALCIUM 1200+D3 PO), Take by mouth., Disp: , Rfl:  .  docusate sodium (COLACE) 250 MG capsule, Take by mouth., Disp: , Rfl:  .  KRILL OIL PO, Take by mouth., Disp: , Rfl:  .  Multiple Vitamin (MULTIVITAMIN) tablet, Take 1 tablet by mouth daily., Disp: , Rfl:  .  Potassium 99 MG TABS, Take by mouth., Disp: , Rfl:  .  vitamin C (ASCORBIC ACID) 500 MG tablet, Take 500 mg by mouth daily., Disp: , Rfl:  .  VITAMIN K PO, Take by mouth., Disp: , Rfl:   Review of Systems  HENT: Positive for congestion, rhinorrhea and sinus pain. Negative for ear pain, sneezing and sore throat.   Respiratory: Positive for cough (dry) and wheezing.   Cardiovascular: Positive for chest  pain.  Gastrointestinal: Positive for abdominal pain, diarrhea and nausea. Negative for vomiting.  Genitourinary: Negative for dysuria.  Musculoskeletal: Positive for neck pain.  Neurological: Positive for headaches.    Social History   Tobacco Use  . Smoking status: Never Smoker  . Smokeless tobacco: Never Used  Substance Use Topics  . Alcohol use: Never    Frequency: Never   Objective:   BP 112/82 (BP Location: Left Arm, Patient Position: Sitting, Cuff Size: Large)   Pulse 80   Temp 98.2 F (36.8 C) (Oral)   Resp 16   Wt 184 lb (83.5 kg)   SpO2 96%   BMI 30.62 kg/m  Vitals:   09/30/17 0938  BP: 112/82  Pulse: 80  Resp: 16  Temp: 98.2 F (36.8 C)  TempSrc: Oral  SpO2: 96%  Weight: 184 lb (83.5 kg)     Physical Exam  Constitutional: She is oriented to person, place, and time. She appears well-developed and well-nourished. No distress.  HENT:  Head: Normocephalic and atraumatic.  Right Ear: External ear and ear canal normal. Tympanic membrane is erythematous and bulging.  Left Ear: Tympanic membrane, external ear and ear canal normal.  Nose: Mucosal edema and rhinorrhea present. Right sinus exhibits maxillary sinus tenderness. Right sinus exhibits no frontal sinus tenderness. Left sinus exhibits maxillary sinus tenderness. Left sinus exhibits no frontal sinus tenderness.  Mouth/Throat: Uvula is midline and mucous  membranes are normal. No uvula swelling. Posterior oropharyngeal erythema present. No oropharyngeal exudate or posterior oropharyngeal edema.  Eyes: Pupils are equal, round, and reactive to light. Conjunctivae are normal. Right eye exhibits no discharge. Left eye exhibits no discharge. No scleral icterus.  Neck: Neck supple. No thyromegaly present.  Cardiovascular: Normal rate, regular rhythm and normal heart sounds.  No murmur heard. Pulmonary/Chest: Effort normal and breath sounds normal. No respiratory distress. She has no wheezes. She has no rales.    Intermittent cough  Musculoskeletal: She exhibits no edema.  Lymphadenopathy:    She has no cervical adenopathy.  Neurological: She is alert and oriented to person, place, and time.  Skin: Skin is warm and dry. Capillary refill takes less than 2 seconds. No rash noted.  Psychiatric: She has a normal mood and affect. Her behavior is normal.  Vitals reviewed.      Assessment & Plan:   1. Acute non-recurrent maxillary sinusitis - time course in addition to initial improvement with acute worsening and sinus tenderness on exam c/w bacterial sinusitis - treat with 10d course of Augmentin - also discussed importance of symptomatic management with flonase, nasal saline, mucinex, etc  2. Non-recurrent acute suppurative otitis media of right ear without spontaneous rupture of tympanic membrane - R TM bulging and erythematous - Augmentin will also treat AOM - return precautions discussed  3. Bronchitis - Lungs clear on exam - recent CXR from earlier this month reviewed and showed mild bronchitic changes which is likely cause of prolonged coughing - discussed with patient that bronchitis is almost always viral in nature - discussed natural course, symptomatic management, and return precautions    Meds ordered this encounter  Medications  . amoxicillin-clavulanate (AUGMENTIN) 875-125 MG tablet    Sig: Take 1 tablet by mouth 2 (two) times daily.    Dispense:  20 tablet    Refill:  0  . benzonatate (TESSALON) 200 MG capsule    Sig: Take 1 capsule (200 mg total) by mouth 2 (two) times daily as needed for cough.    Dispense:  60 capsule    Refill:  0     Return if symptoms worsen or fail to improve.   The entirety of the information documented in the History of Present Illness, Review of Systems and Physical Exam were personally obtained by me. Portions of this information were initially documented by Irving BurtonEmily Ratchford, CMA and reviewed by me for thoroughness and accuracy.     Erasmo DownerBacigalupo, Evren Shankland M, MD, MPH [facility] 09/30/2017 10:07 AM

## 2017-09-30 NOTE — Telephone Encounter (Signed)
Please review

## 2017-09-30 NOTE — Telephone Encounter (Signed)
Can take Zofran prn for N/V.  If continues, can consider different antibiotic.  Rx sent to pharmacy.  Erasmo DownerBacigalupo, Angela M, MD, MPH [facility] 09/30/2017 4:42 PM

## 2017-09-30 NOTE — Telephone Encounter (Signed)
Pt advised.

## 2017-09-30 NOTE — Patient Instructions (Signed)

## 2017-09-30 NOTE — Telephone Encounter (Signed)
Pt stated that at her OV today it was discussed that some antibiotics make her nauseous and she was advised that if amoxicillin-clavulanate (AUGMENTIN) 875-125 MG tablet made her nauseous to call back and Dr. B would send something in to help. [facility] [address] Please advise. Thanks TNP

## 2017-09-30 NOTE — Telephone Encounter (Signed)
Pt called back stated that she vomited and knows that it was the amoxicillin-clavulanate (AUGMENTIN) 875-125 MG tablet that made her sick so she is going to stop taking the medication until she hears back about getting the medication for the nausea. Please advise. Thanks TNP

## 2017-10-09 ENCOUNTER — Telehealth: Payer: Self-pay | Admitting: Family Medicine

## 2017-10-09 NOTE — Telephone Encounter (Signed)
FYI--Pt has not scheduled appointment with dermatologist for skin tags.[facility] reached out to pt without return phone call and I also spoke to pt to give her their contact information

## 2017-10-16 ENCOUNTER — Ambulatory Visit
Admission: RE | Admit: 2017-10-16 | Discharge: 2017-10-16 | Disposition: A | Discharge status: Home / Self Care | Payer: BC Managed Care – PPO | Source: Ambulatory Visit | Attending: Family Medicine | Admitting: Family Medicine

## 2017-10-16 DIAGNOSIS — Z1239 Encounter for other screening for malignant neoplasm of breast: Secondary | ICD-10-CM

## 2017-10-16 DIAGNOSIS — M85851 Other specified disorders of bone density and structure, right thigh: Secondary | ICD-10-CM | POA: Insufficient documentation

## 2017-10-16 DIAGNOSIS — Z780 Asymptomatic menopausal state: Secondary | ICD-10-CM | POA: Diagnosis present

## 2017-10-16 DIAGNOSIS — M8580 Other specified disorders of bone density and structure, unspecified site: Secondary | ICD-10-CM | POA: Insufficient documentation

## 2017-10-16 DIAGNOSIS — Z1231 Encounter for screening mammogram for malignant neoplasm of breast: Secondary | ICD-10-CM | POA: Insufficient documentation

## 2017-10-17 ENCOUNTER — Telehealth: Payer: Self-pay

## 2017-10-17 NOTE — Telephone Encounter (Signed)
Left message to call back  

## 2017-10-17 NOTE — Telephone Encounter (Signed)
-----   Message from Erasmo Downer, MD sent at 10/16/2017  2:59 PM EDT ----- Bone density scan shows osteopenia (this is some bone loss, but not as bad as osteoporosis).  Recommend regular weight bearing exercise, avoiding smoking, and adequate Ca ( /day) and Vit D (1000 units daily) via diet or supplement.  We will recheck in 2 years to ensure this hasn't worsened.  Erasmo Downer, MD, MPH [facility] 10/16/2017 2:59 PM

## 2017-10-17 NOTE — Telephone Encounter (Signed)
Advised patient of results.  

## 2017-10-21 ENCOUNTER — Inpatient Hospital Stay
Admission: RE | Admit: 2017-10-21 | Discharge: 2017-10-21 | Disposition: A | Discharge status: Home / Self Care | Payer: Self-pay | Source: Ambulatory Visit | Attending: *Deleted | Admitting: *Deleted

## 2017-10-21 ENCOUNTER — Other Ambulatory Visit: Payer: Self-pay | Admitting: *Deleted

## 2017-10-21 ENCOUNTER — Telehealth: Payer: Self-pay

## 2017-10-21 DIAGNOSIS — Z9289 Personal history of other medical treatment: Secondary | ICD-10-CM

## 2017-10-21 NOTE — Telephone Encounter (Signed)
Left message advising pt. OK per DPR. 

## 2017-10-21 NOTE — Telephone Encounter (Signed)
-----   Message from Erasmo Downer, MD sent at 10/21/2017  2:47 PM EDT ----- Normal mammogram.  Repeat in 1 year.  Erasmo Downer, MD, MPH [facility] 10/21/2017 2:47 PM

## 2018-09-18 ENCOUNTER — Encounter: Payer: Self-pay | Admitting: Family Medicine

## 2018-10-02 ENCOUNTER — Telehealth: Payer: Self-pay

## 2018-10-02 NOTE — Telephone Encounter (Signed)
Called patient regarding PHQ-2, she states that she no longer lives in the area.

## 2019-08-07 IMAGING — CR DG CHEST 2V
1 series · 2 of 2 positions shown · non-contrast
Comparison: None.

CLINICAL DATA: Dry cough and chest pain for 5 days, crackles at
RIGHT lung base

EXAM:
CHEST - 2 VIEW

[Series 1: dg chest 2 view · 0.14mm/px · 2 of 2 slices shown]
[im 1/2]
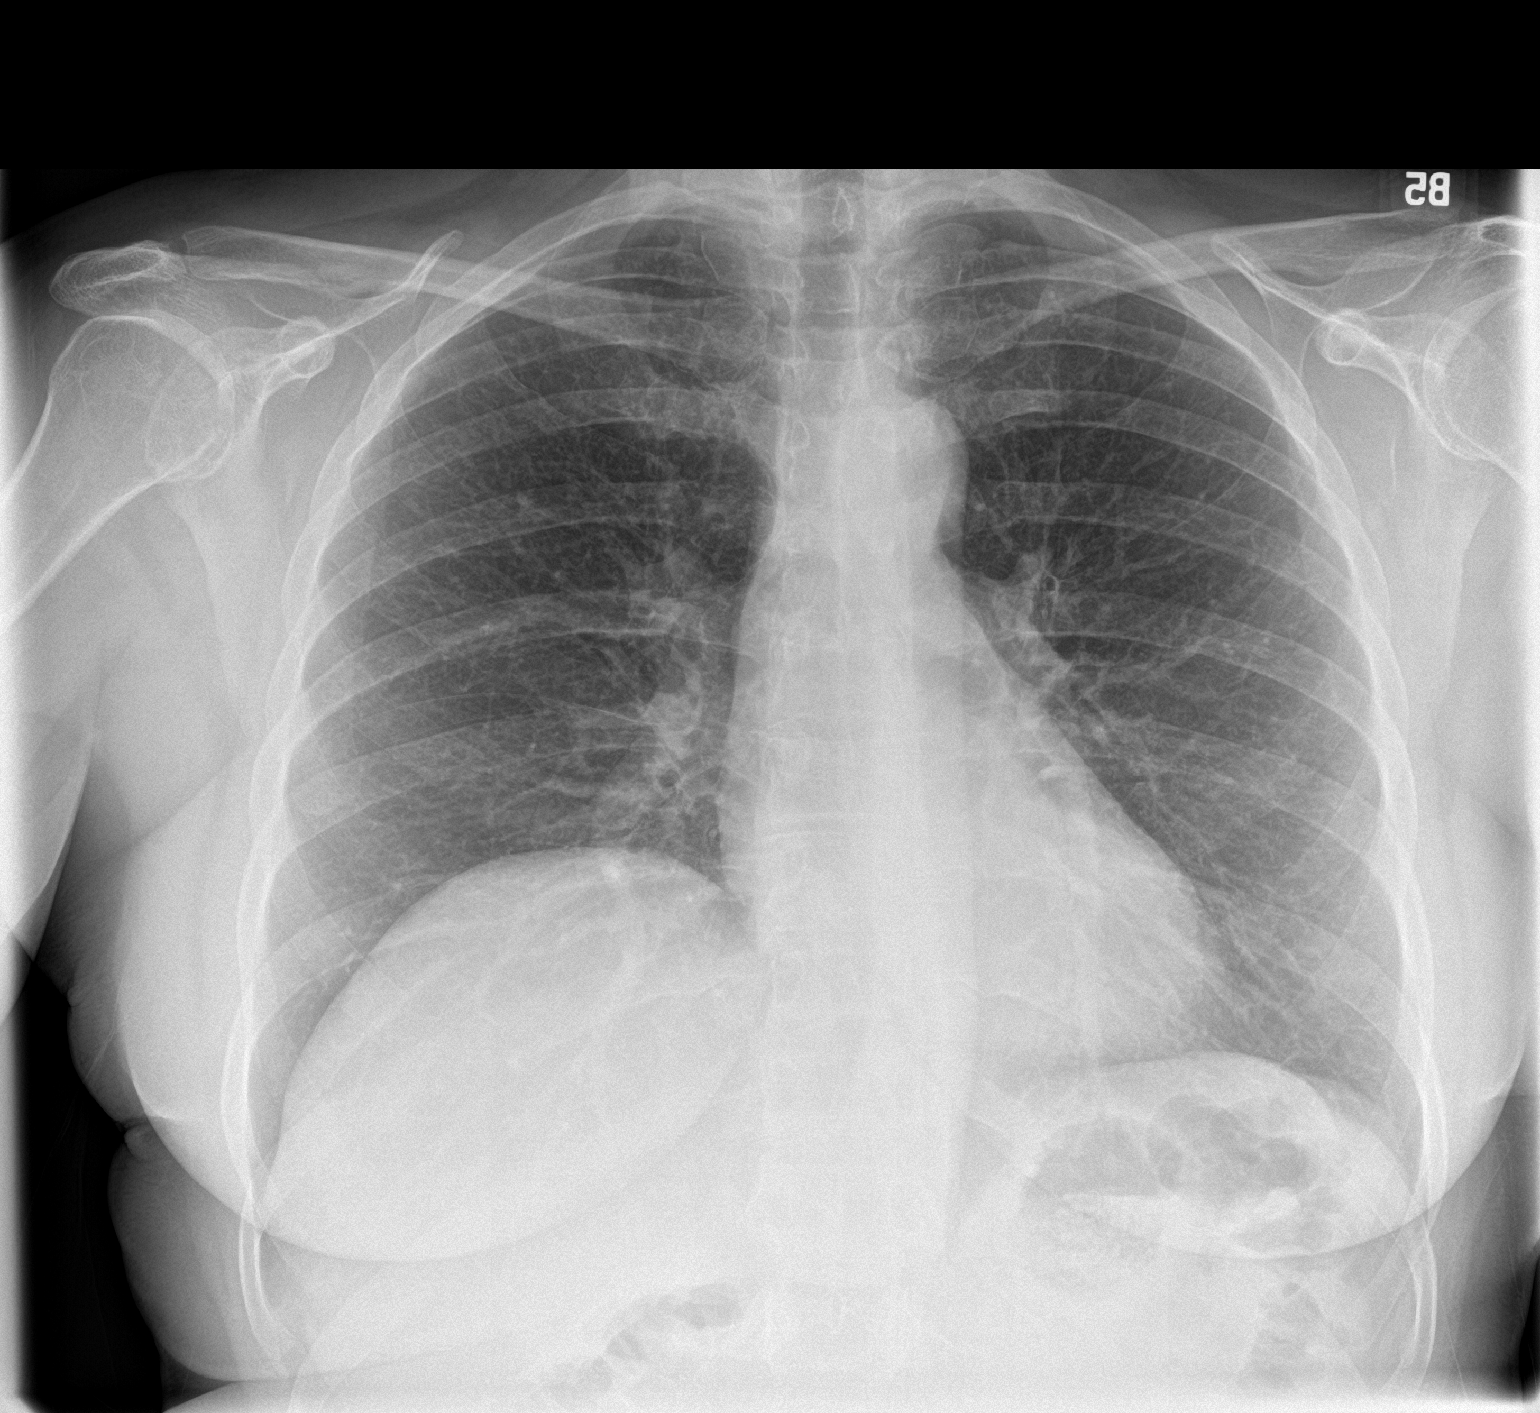
[im 2/2]
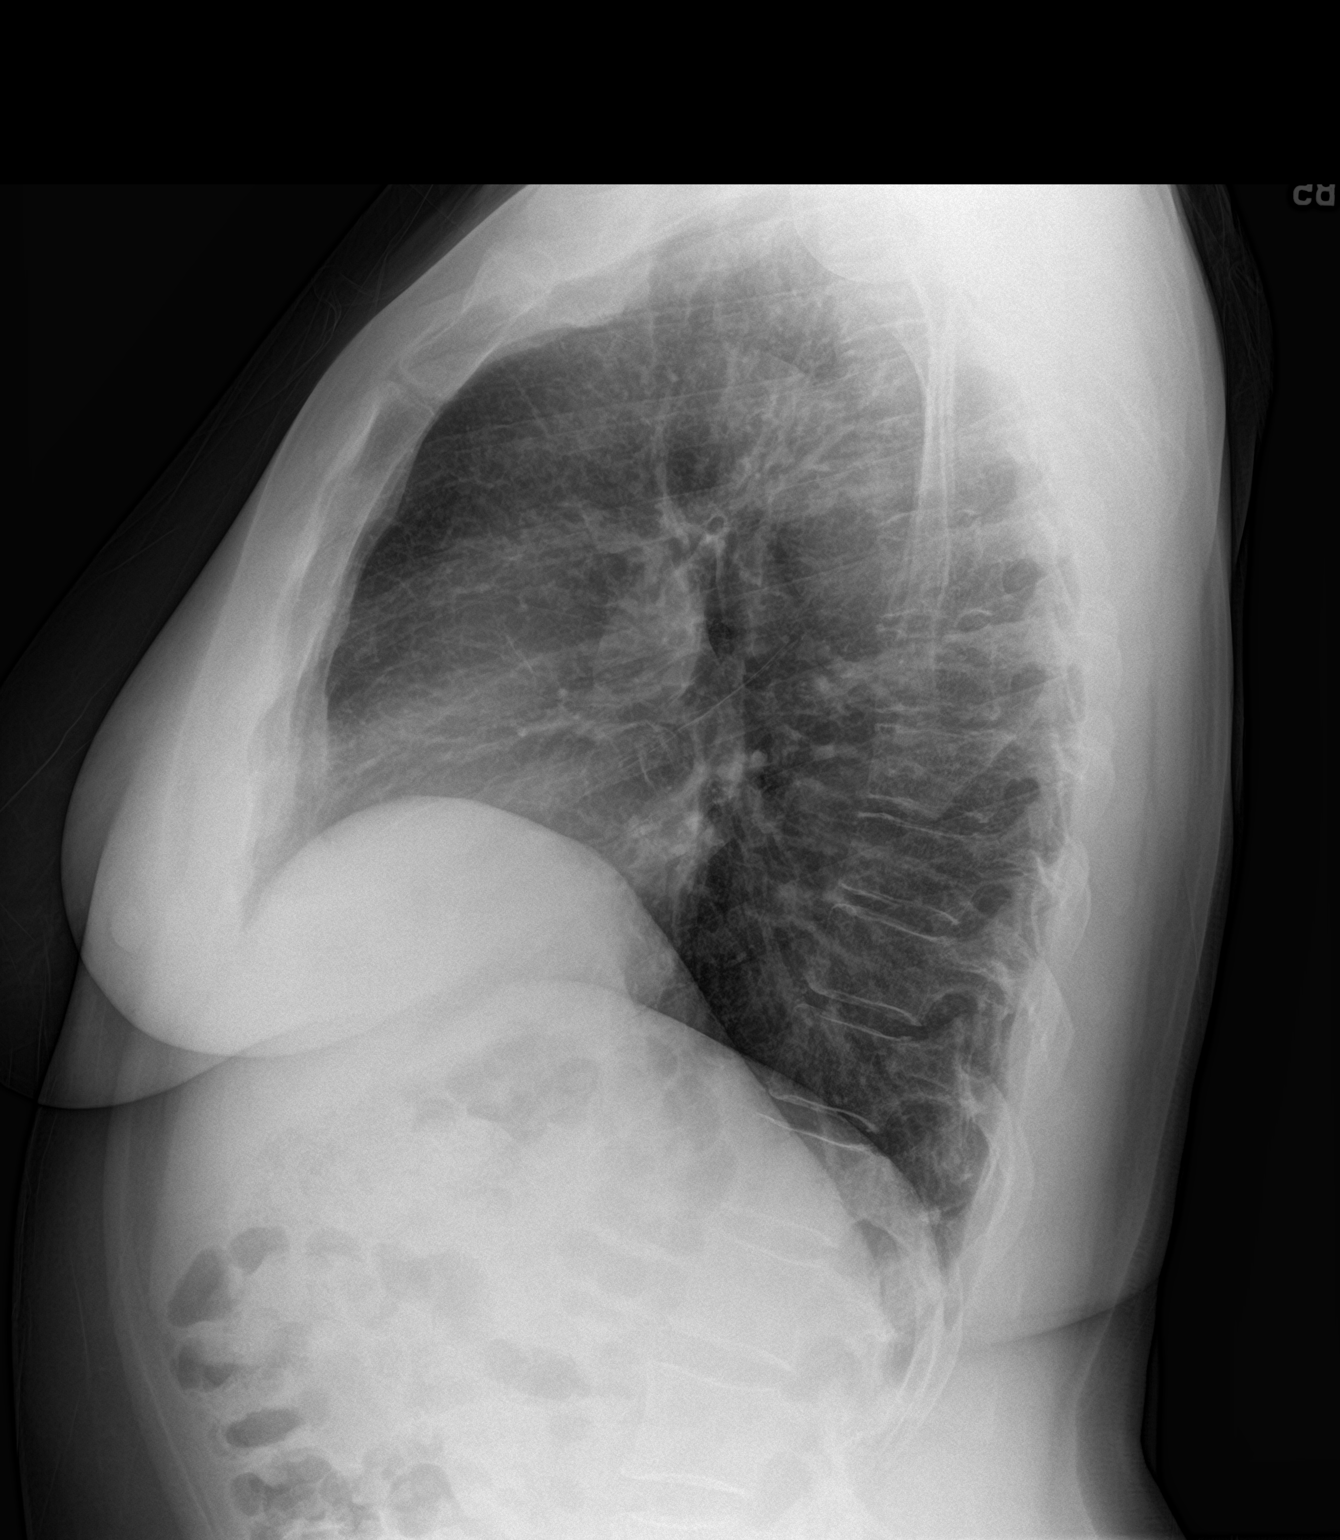

[2 of 2 positions shown; findings below may reference images not displayed]

FINDINGS: Normal heart size, mediastinal contours, and pulmonary vascularity.

Mild eventration of anterior RIGHT diaphragm

Mild peribronchial thickening.

No pulmonary infiltrate, pleural effusion, or pneumothorax.

Bones unremarkable.
IMPRESSION: Mild bronchitic changes without infiltrate.
